# Patient Record
Sex: Female | Born: 1990 | Hispanic: Yes | Marital: Married | Smoking: Never smoker
Health system: Southern US, Community
[De-identification: ages and names within clinical notes are randomized; demographics above are authoritative.]

## PROBLEM LIST (undated history)

## (undated) DIAGNOSIS — Z789 Other specified health status: Secondary | ICD-10-CM

## (undated) HISTORY — PX: CHOLECYSTECTOMY: SHX55

---

## 2015-12-12 ENCOUNTER — Other Ambulatory Visit (HOSPITAL_COMMUNITY): Payer: Self-pay | Admitting: Obstetrics and Gynecology

## 2015-12-12 ENCOUNTER — Encounter (HOSPITAL_COMMUNITY): Payer: Self-pay | Admitting: Obstetrics and Gynecology

## 2015-12-12 DIAGNOSIS — Z3A24 24 weeks gestation of pregnancy: Secondary | ICD-10-CM

## 2015-12-12 DIAGNOSIS — R9389 Abnormal findings on diagnostic imaging of other specified body structures: Secondary | ICD-10-CM

## 2015-12-12 DIAGNOSIS — Z3689 Encounter for other specified antenatal screening: Secondary | ICD-10-CM

## 2015-12-12 DIAGNOSIS — Q6602 Congenital talipes equinovarus, left foot: Secondary | ICD-10-CM

## 2015-12-20 ENCOUNTER — Encounter (HOSPITAL_COMMUNITY): Payer: Self-pay

## 2015-12-21 ENCOUNTER — Ambulatory Visit (HOSPITAL_COMMUNITY): Payer: Self-pay | Attending: Obstetrics and Gynecology

## 2015-12-21 ENCOUNTER — Ambulatory Visit (HOSPITAL_COMMUNITY): Admission: RE | Admit: 2015-12-21 | Payer: Self-pay | Source: Ambulatory Visit

## 2016-02-20 ENCOUNTER — Encounter (HOSPITAL_COMMUNITY): Payer: Self-pay

## 2016-02-21 ENCOUNTER — Encounter (HOSPITAL_COMMUNITY): Payer: Self-pay

## 2016-02-21 ENCOUNTER — Ambulatory Visit (HOSPITAL_COMMUNITY)
Admission: RE | Admit: 2016-02-21 | Discharge: 2016-02-21 | Disposition: A | Discharge status: Home / Self Care | Payer: Medicaid Other | Source: Ambulatory Visit | Attending: Obstetrics and Gynecology | Admitting: Obstetrics and Gynecology

## 2016-02-21 DIAGNOSIS — Z3A33 33 weeks gestation of pregnancy: Secondary | ICD-10-CM | POA: Insufficient documentation

## 2016-02-21 DIAGNOSIS — Z3A24 24 weeks gestation of pregnancy: Secondary | ICD-10-CM

## 2016-02-21 DIAGNOSIS — O359XX0 Maternal care for (suspected) fetal abnormality and damage, unspecified, not applicable or unspecified: Secondary | ICD-10-CM | POA: Insufficient documentation

## 2016-02-21 DIAGNOSIS — R9389 Abnormal findings on diagnostic imaging of other specified body structures: Secondary | ICD-10-CM

## 2016-02-21 DIAGNOSIS — Z315 Encounter for genetic counseling: Secondary | ICD-10-CM | POA: Insufficient documentation

## 2016-02-21 DIAGNOSIS — O358XX0 Maternal care for other (suspected) fetal abnormality and damage, not applicable or unspecified: Secondary | ICD-10-CM | POA: Insufficient documentation

## 2016-02-21 DIAGNOSIS — Q6602 Congenital talipes equinovarus, left foot: Secondary | ICD-10-CM

## 2016-02-21 DIAGNOSIS — Z3689 Encounter for other specified antenatal screening: Secondary | ICD-10-CM

## 2016-02-21 HISTORY — DX: Other specified health status: Z78.9

## 2016-02-21 NOTE — Progress Notes (Signed)
Genetic Counseling  High-Risk Gestation Note  Appointment Date:  02/21/2016 Referred By: Rutha Bouchard, MD Date of Birth:  [birth year]   Pregnancy History: [id number] Estimated Date of Delivery: 04/09/16 Estimated Gestational Age: 26w1dAttending: PBenjaman Lobe MD   I met with Ms. Melanie Henegarfor genetic counseling because of possible fetal left club foot visualized through her OB office.   In summary:  Discussed ultrasound findings in detail  Ultrasound today unable to definitively see left foot due to advanced gestational age and fetal positive; right fetal foot visualized within normal limits  Reviewed options for additional screening  NIPS-declined  Reviewed options for diagnostic testing, including risks, benefits, limitations and alternatives-declined amniocentesis  Discussed plan for postnatal evaluation of baby and referral to pediatric orthopedics at that time if club foot is confirmed postnatally  Patient declined prenatal consultation with peds ortho and would prefer to await postnatal evaluation  Reviewed family history concerns  ACOG carrier screening guidelines reviewed-patient declined carrier screening  We began by reviewing the ultrasound in detail. Ms. LRazaviOB office visualized possible left fetal club foot. Ultrasound was performed today. There is some suspicion for a left club foot, but this could not be confirmed today due to advanced gestational age and fetal position. Right foot visualized within normal limits. Complete ultrasound results under separate cover.   We discussed that clubfoot/feet is a term that actually describes three distinct anomalies (talipes equinovarus, talipes calcaneovalgus, and metatarsus varus) and occurs in 1 in 1000 births. The most common type of clubfeet, talipes equinovarus, is characterized by forefoot adduction with supination, heel varus, and ankle equines, which cannot be brought back to a neutral position. They were counseled  that clubfeet can be an isolated difference, occur as a feature of an underlying syndrome, or the result of neurological impairment. We discussed that the most likely mode of inheritance for nonsyndromic, isolated clubfoot/feet is multifactorial. There is a known genetic component for multifactorial clubfoot, as demonstrated by twin studies; environmental factors also play a role, including infection, drugs, and intrauterine environment (oligohydramnios, fetal positioning). While the majority of cases of clubfoot/feet are isolated, it is a feature in more than 200 known genetic syndromes, including both chromosomal and single gene conditions. We reviewed chromosomes, nondisjunction and the features of Down syndrome, trisomy 138 and 136 We discussed that the risk for other chromosome aberrations is slightly increased (microdeletions, microduplications, insertions, translocations). We reviewed single gene conditions including common inheritance patterns and associated risks for recurrence.   We discussed additional prenatal screening and testing options for fetal aneuploidy including noninvasive prenatal screening (NIPS)/prenatal cell free DNA testing and amniocentesis. We reviewed the risks, benefits, and limitations of each. Ms. LBollendeclined additional prenatal screening and testing for chromosome conditions at this time. She understands that prenatal screening or testing is not as readily available for single gene conditions.   We discussed the option of meeting with a pediatric orthopedic specialist to discuss expectant management and treatment of clubfeet. Ms. LHagansdeclined this option. She is comfortable with postnatal evaluation and referral to pediatric orthopedics at that time, if club foot is confirmed to be present.   Both family histories were reviewed and found to be contributory for a maternal first cousin to the patient with Down syndrome and a paternal uncle to the father of the pregnancy with  Down syndrome. We discussed that 95% of cases of Down syndrome are not inherited and are the result of non-disjunction.  Three to 4% of cases of Down syndrome  are the result of a translocation involving chromosome #21.  We discussed the option of chromosome analysis to determine if an individual is a carrier of a balanced translocation involving chromosome #21.  If an individual carries a balanced translocation involving chromosome #21, then the chance to have a baby with Down syndrome would be greater than the maternal age-related risk.   The reported family histories are most consistent with sporadic occurrence of Down syndrome. Without further information regarding the provided family history, an accurate genetic risk cannot be calculated. Further genetic counseling is warranted if more information is obtained.  Ms. Melanie Miranda was provided with written information regarding cystic fibrosis (CF), spinal muscular atrophy (SMA) and hemoglobinopathies including the carrier frequency, availability of carrier screening and prenatal diagnosis if indicated.  In addition, we discussed that CF and hemoglobinopathies are routinely screened for as part of the [facility] newborn screening panel.  She declined screening for CF, SMA and hemoglobinopathies..   Ms. Melanie Miranda denied exposure to environmental toxins or chemical agents. She denied the use of alcohol, tobacco or street drugs. She denied significant viral illnesses during the course of her pregnancy. Her medical and surgical histories were noncontributory.   I counseled Ms. Melanie Miranda regarding the above risks and available options.  The approximate face-to-face time with the genetic counselor was 25 minutes.  Chipper Oman, MS Certified Genetic Counselor 02/21/2016

## 2016-02-28 ENCOUNTER — Encounter (HOSPITAL_COMMUNITY): Payer: Self-pay

## 2016-02-28 ENCOUNTER — Other Ambulatory Visit (HOSPITAL_COMMUNITY): Payer: Self-pay

## 2016-10-24 ENCOUNTER — Encounter (HOSPITAL_COMMUNITY): Payer: Self-pay

## 2017-08-08 IMAGING — US US MFM OB DETAIL+14 WK
1 series · 14 of 28 positions shown · non-contrast
Comparison: none

[Series 1: us mfm ob detail+14 wk · 14 of 66 slices shown]
[im 3/66]
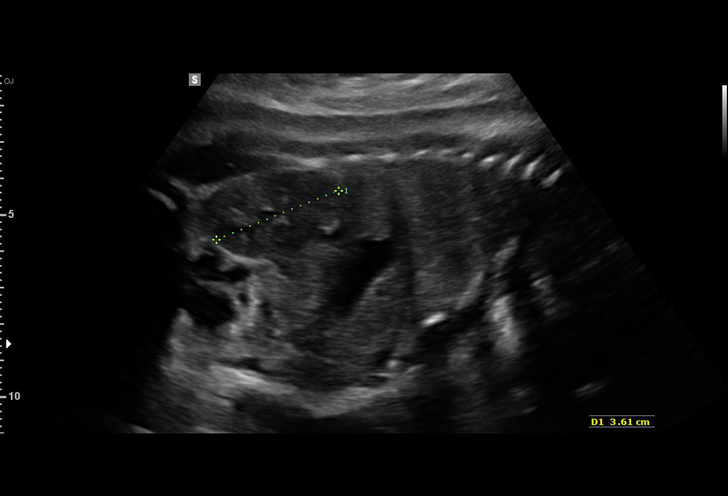
[im 8/66]
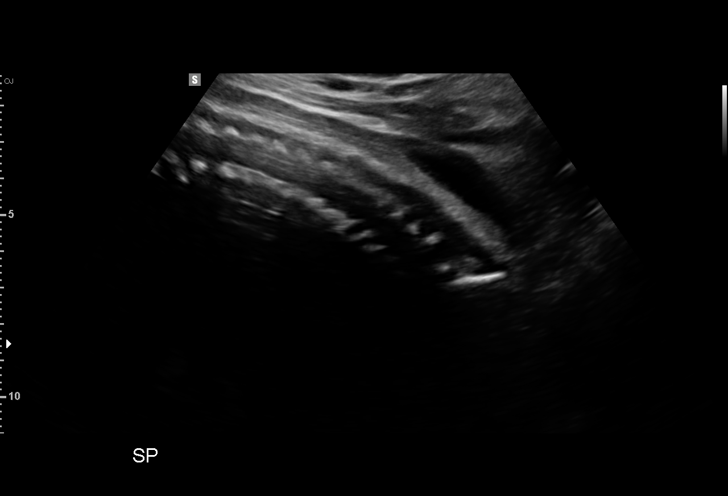
[im 13/66]
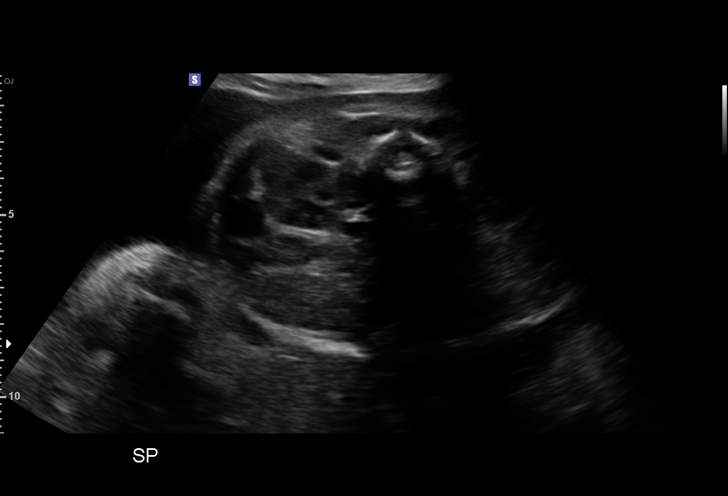
[im 17/66]
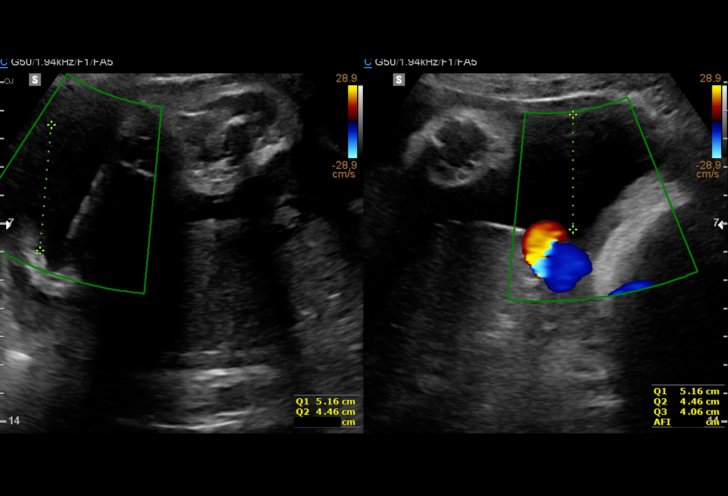
[im 22/66]
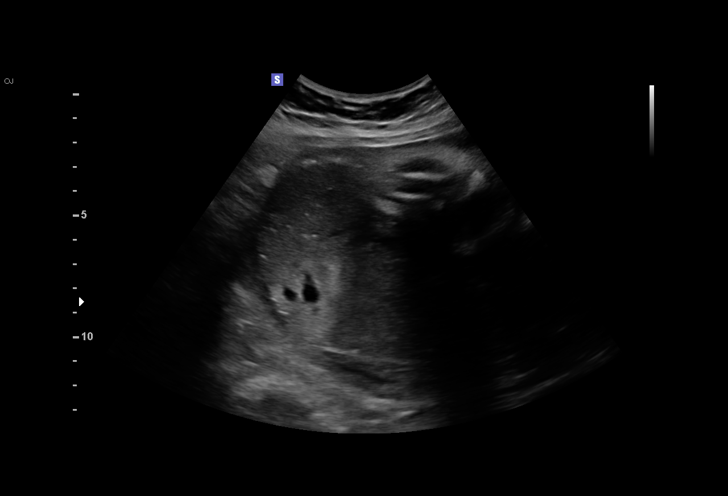
[im 27/66]
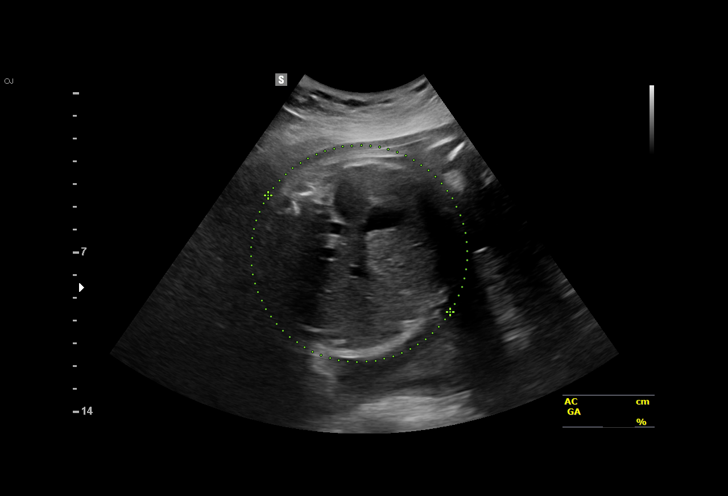
[im 32/66]
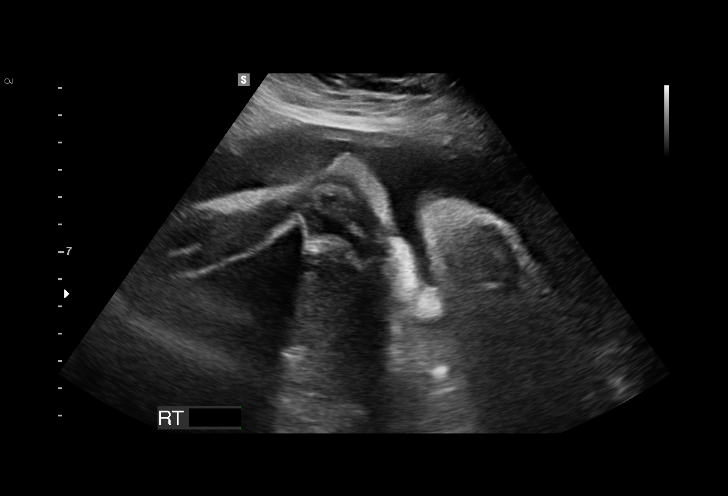
[im 37/66]
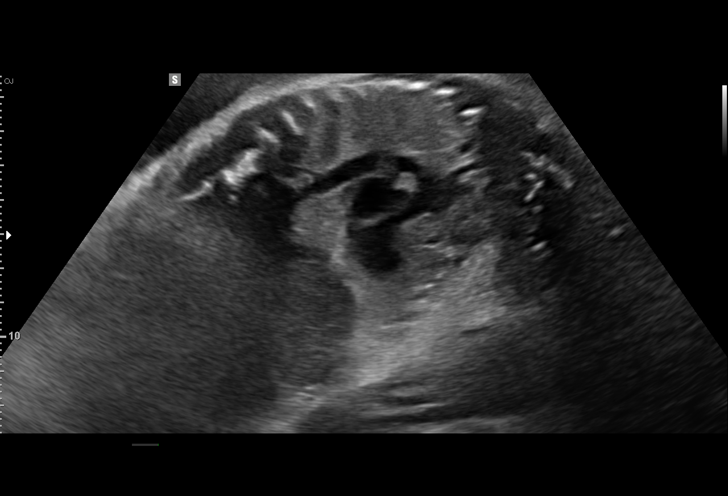
[im 41/66]
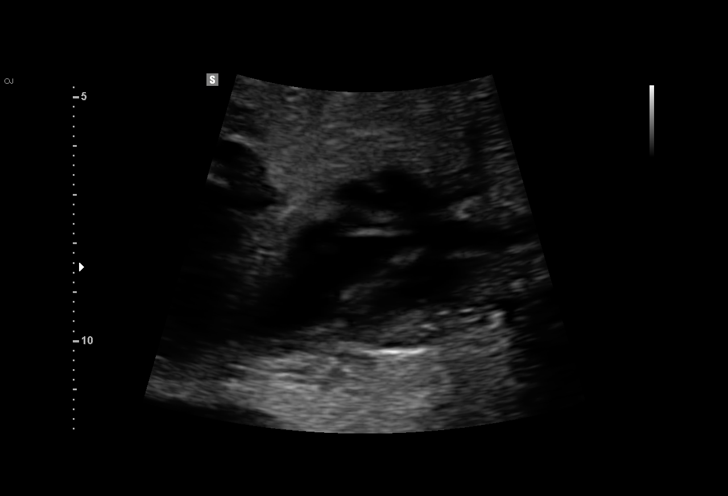
[im 46/66]
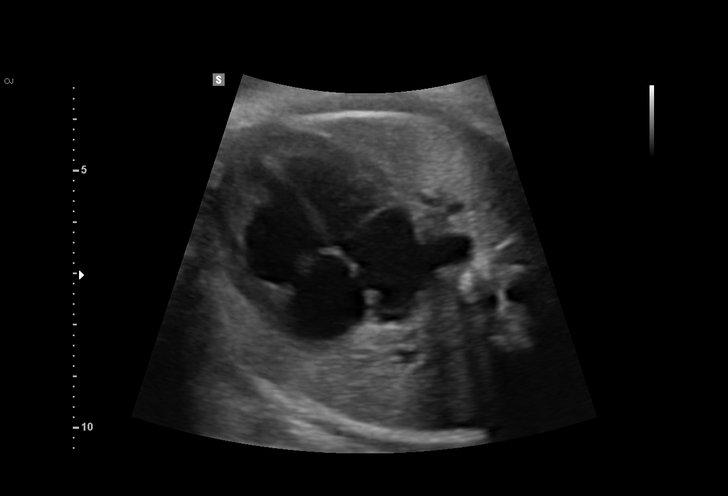
[im 51/66]
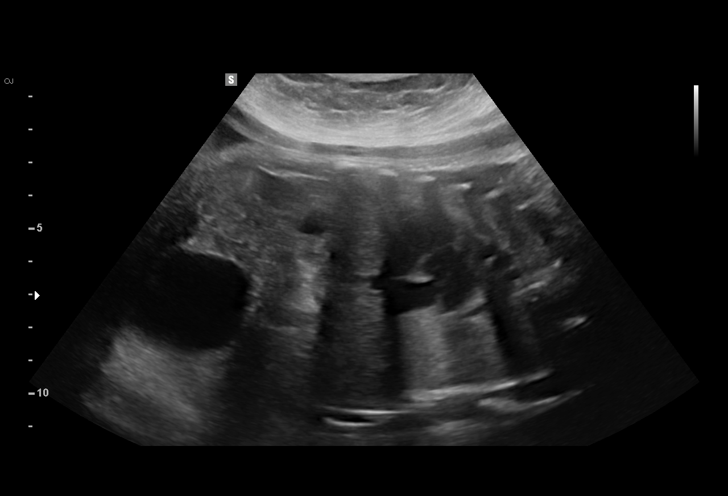
[im 56/66]
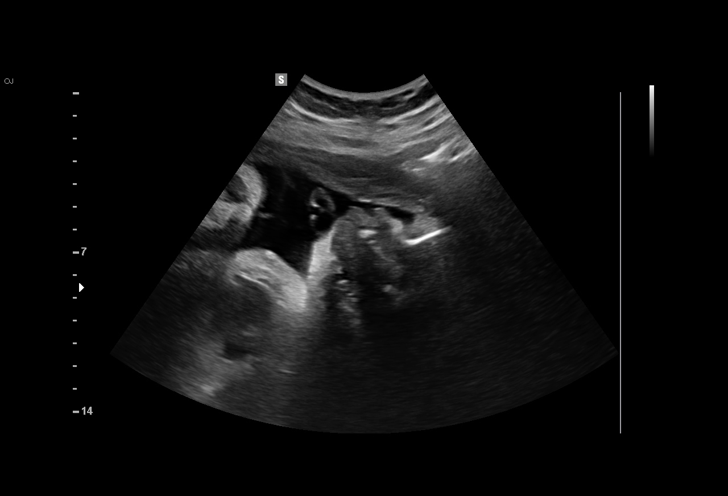
[im 61/66]
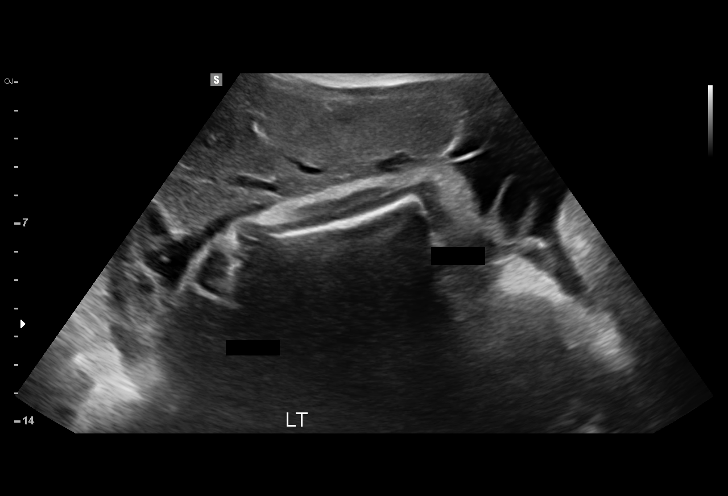
[im 66/66]
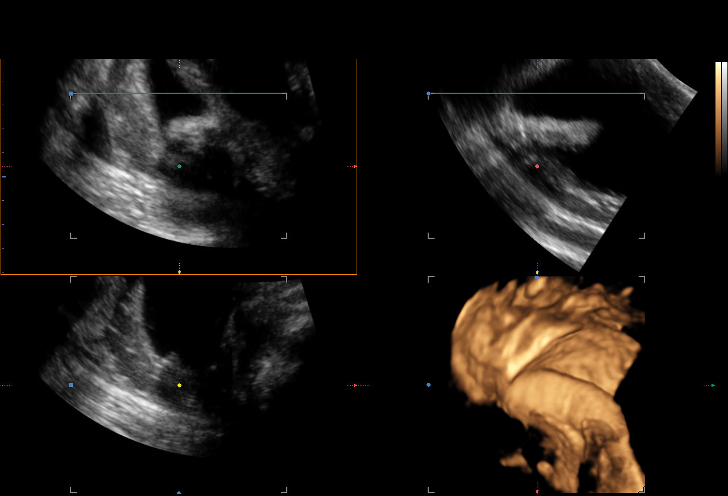

[14 of 28 positions shown; findings below may reference images not displayed]

[HOSPITAL],[HOSPITAL]

1  JARDIVAN ROVERI            [id number]      [id number]     [id number]
Indications

33 weeks gestation of pregnancy
Detailed fetal anatomic survey                 Z36
Club foot (left)- low risk Quad
OB History

Gravidity:    3         Term:   1        Prem:   0        SAB:   1
TOP:          0       Ectopic:  0        Living: 1
Fetal Evaluation

Num Of Fetuses:     1
Fetal Heart         140
Rate(bpm):
Cardiac Activity:   Observed
Presentation:       Cephalic
Placenta:           Posterior, above cervical os
P. Cord Insertion:  Not well visualized

Amniotic Fluid
AFI FV:      Subjectively within normal limits

AFI Sum(cm)     %Tile       Largest Pocket(cm)
13.68           45

RUQ(cm)                     LUQ(cm)        LLQ(cm)
5.16
Biometry

BPD:      89.1  mm     G. Age:  36w 0d         98  %    CI:        81.85   %   70 - 86
FL/HC:      20.0   %   19.9 -
HC:      310.8  mm     G. Age:  34w 5d         55  %    HC/AC:      1.04       0.96 -
AC:      299.1  mm     G. Age:  33w 6d         72  %    FL/BPD:     69.9   %   71 - 87
FL:       62.3  mm     G. Age:  32w 2d         19  %    FL/AC:      20.8   %   20 - 24
HUM:      56.6  mm     G. Age:  32w 6d         54  %

Est. FW:    6627  gm          5 lb      70  %
Gestational Age

U/S Today:     34w 2d                                        EDD:   04/01/16
Best:          33w 1d    Det. By:   Early Ultrasound         EDD:   04/09/16
(09/07/15)
Anatomy

Cranium:               Appears normal         Aortic Arch:            Appears normal
Cavum:                 Not well visualized    Ductal Arch:            Appears normal
Ventricles:            Appears normal         Diaphragm:              Appears normal
Choroid Plexus:        Not well visualized    Stomach:                Appears normal, left
sided
Cerebellum:            Not well visualized    Abdomen:                Appears normal
Posterior Fossa:       Not well visualized    Abdominal Wall:         Not well visualized
Nuchal Fold:           Not well visualized    Cord Vessels:           Appears normal (3
vessel cord)
Face:                  Orbits appear          Kidneys:                Appear normal
normal
Lips:                  Appears normal         Bladder:                Appears normal
Thoracic:              Appears normal         Spine:                  Appears normal
Heart:                 Appears normal         Upper Extremities:      Visualized
(4CH, axis, and situs
RVOT:                  Appears normal         Lower Extremities:      See comments
LVOT:                  Appears normal

Other:  Male gender. Technically difficult due to advanced GA and fetal
position.
Cervix Uterus Adnexa

Cervix
Not visualized (advanced GA >47wks)

Uterus
No abnormality visualized.

Left Ovary
Within normal limits.

Right Ovary
Within normal limits.
Impression

Single IUP at 33w 1d
Patient was referred due to possible left club foot
A normal right foot was appreciated
There is some suspicion of a left club foot, but unable to
definitively see due to fetal position and advanced gestational
age
Limited views of the cranial anatomy were obtained
The remainder of the fetal anatomy appears normal
The estimated fetal weight is at the 70th %tile
Posterior placenta without previa
Normal amniotic fluid volume
Recommendations

Unlikely to be able to better visualize at future appointment
given late gestational age
Notify Peds at time of delivery about possible left club foot
Otherwise, follow up ultrasounds as clinically indicated

## 2024-07-23 ENCOUNTER — Emergency Department (HOSPITAL_BASED_OUTPATIENT_CLINIC_OR_DEPARTMENT_OTHER)
Admission: EM | Admit: 2024-07-23 | Discharge: 2024-07-23 | Disposition: A | Discharge status: Home / Self Care | Payer: Self-pay | Attending: Emergency Medicine | Admitting: Emergency Medicine

## 2024-07-23 ENCOUNTER — Other Ambulatory Visit: Payer: Self-pay

## 2024-07-23 ENCOUNTER — Encounter (HOSPITAL_BASED_OUTPATIENT_CLINIC_OR_DEPARTMENT_OTHER): Payer: Self-pay

## 2024-07-23 DIAGNOSIS — J0100 Acute maxillary sinusitis, unspecified: Secondary | ICD-10-CM | POA: Insufficient documentation

## 2024-07-23 MED ORDER — ONDANSETRON HCL 4 MG PO TABS: 4.0000 mg | ORAL_TABLET | Freq: Four times a day (QID) | ORAL | 0 refills | Status: AC

## 2024-07-23 MED ORDER — AMOXICILLIN-POT CLAVULANATE 875-125 MG PO TABS: 1.0000 | ORAL_TABLET | Freq: Two times a day (BID) | ORAL | 0 refills | Status: AC

## 2024-07-23 NOTE — Discharge Instructions (Signed)
 I sent your prescription for Augmentin.  You may take this once in the morning once in the evening for the next 10 days.  Have also sent you Zofran for nausea.  Follow-up with your primary care doctor.  Avoid using nasal sprays that contain Oxymetazoline for the next 1 month.  If you do not have a primary care, please review your paperwork for information on how to establish with a PCP.

## 2024-07-23 NOTE — ED Triage Notes (Signed)
 Pt arrives with c/o nasal congestion that started 2 weeks ago. Per pt, chills, bodyaches, and headaches started 2 days ago. Pt reports being on antibiotics without relief. Pt denies sore throat or cough.

## 2024-07-23 NOTE — ED Provider Notes (Signed)
 " [facility] Provider Note  CSN: [id number] Arrival date & time: 07/23/24 1518  Chief Complaint(s) Nasal Congestion  HPI Melanie Miranda is a 34 y.o. female who is here today for 2 weeks of nasal congestion.  She has been using Afrin nasal sprays without relief.  She had some leftover amoxicillin that she has been taking.   Past Medical History Past Medical History:  Diagnosis Date   Medical history non-contributory    Patient Active Problem List   Diagnosis Date Noted   Suspected fetal abnormality affecting management of mother 02/21/2016   Home Medication(s) Prior to Admission medications  Medication Sig Start Date End Date Taking? Authorizing Provider  amoxicillin-clavulanate (AUGMENTIN) 875-125 MG tablet Take 1 tablet by mouth every 12 (twelve) hours. 07/23/24  Yes Mannie Pac T, DO  ondansetron (ZOFRAN) 4 MG tablet Take 1 tablet (4 mg total) by mouth every 6 (six) hours. 07/23/24  Yes Mannie Pac DASEN, DO  Prenatal Vit-Fe Fumarate-FA (PRENATAL VITAMIN PO) Take by mouth.    [provider]                                                                                                                                    Past Surgical History Past Surgical History:  Procedure Laterality Date   CHOLECYSTECTOMY     Family History History reviewed. No pertinent family history.  Social History Social History[1] Allergies Patient has no known allergies.  Review of Systems Review of Systems  Physical Exam Vital Signs  I have reviewed the triage vital signs BP 126/86 (BP Location: Right Arm)   Pulse 89   Temp 98.3 F (36.8 C)   Resp 18   Wt 93.4 kg   SpO2 100%   Physical Exam Vitals and nursing note reviewed.  Constitutional:      Appearance: Normal appearance.  HENT:     Nose: Congestion present.     Comments: Bilateral maxillary sinus tenderness    Mouth/Throat:     Mouth: Mucous membranes are moist.      Pharynx: No oropharyngeal exudate.  Eyes:     Extraocular Movements: Extraocular movements intact.  Cardiovascular:     Rate and Rhythm: Normal rate.  Pulmonary:     Effort: Pulmonary effort is normal.  Abdominal:     General: There is no distension.  Neurological:     General: No focal deficit present.     Mental Status: She is alert.     ED Results and Treatments Labs (all labs ordered are listed, but only abnormal results are displayed) Labs Reviewed - No data to display  Radiology No results found.  Pertinent labs & imaging results that were available during my care of the patient were reviewed by me and considered in my medical decision making (see MDM for details).  Medications Ordered in ED Medications - No data to display                                                                                                                                   Procedures Procedures  (including critical care time)  Medical Decision Making / ED Course   This patient presents to the ED for concern of nasal congestion sinus pain, this involves an extensive number of treatment options, and is a complaint that carries with it a high risk of complications and morbidity.  The differential diagnosis includes viral syndrome, oxymetazoline use, sinus infection.  MDM: Given duration of patient's symptoms believe it is appropriate to start on antibiotics for sinusitis.  Will send her prescription for Augmentin.  Patient nontoxic-appearing.  Do not believe blood work is indicated.  Counseled her on not using Afrin nasal spray for an extended duration.  Appropriate for discharge.   Additional history obtained: -Additional history obtained from partner at bedside -External records from outside source obtained and reviewed including: Chart review including previous notes,  labs, imaging, consultation notes   Lab Tests: -I ordered, reviewed, and interpreted labs.   The pertinent results include:   Labs Reviewed - No data to display    Medicines ordered and prescription drug management: Meds ordered this encounter  Medications   amoxicillin-clavulanate (AUGMENTIN) 875-125 MG tablet    Sig: Take 1 tablet by mouth every 12 (twelve) hours.    Dispense:  14 tablet    Refill:  0   ondansetron (ZOFRAN) 4 MG tablet    Sig: Take 1 tablet (4 mg total) by mouth every 6 (six) hours.    Dispense:  12 tablet    Refill:  0    -I have reviewed the patients home medicines and have made adjustments as needed   Social Determinants of Health:  Factors impacting patients care include: Lack of access to primary care   Reevaluation: After the interventions noted above, I reevaluated the patient and found that they have :improved  Co morbidities that complicate the patient evaluation  Past Medical History:  Diagnosis Date   Medical history non-contributory       Dispostion: I considered admission for this patient, however she is appropriate for discharge.     Final Clinical Impression(s) / ED Diagnoses Final diagnoses:  Acute non-recurrent maxillary sinusitis     @PCDICTATION @     [1]  Social History Tobacco Use   Smoking status: Never     Mannie Pac T, DO 07/23/24 1646  "
